# Patient Record
Sex: Male | Born: 2000 | Race: White | Hispanic: No | Marital: Single
Health system: Southern US, Community
[De-identification: ages and names within clinical notes are randomized; demographics above are authoritative.]

## PROBLEM LIST (undated history)

## (undated) DIAGNOSIS — J302 Other seasonal allergic rhinitis: Secondary | ICD-10-CM

## (undated) DIAGNOSIS — F909 Attention-deficit hyperactivity disorder, unspecified type: Secondary | ICD-10-CM

## (undated) HISTORY — PX: TYMPANOSTOMY: SHX2586

---
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
## 2023-11-04 ENCOUNTER — Ambulatory Visit: Admission: RE | Admit: 2023-11-04 | Payer: Self-pay | Source: Ambulatory Visit

## 2023-11-11 ENCOUNTER — Ambulatory Visit
Admission: RE | Admit: 2023-11-11 | Discharge: 2023-11-11 | Disposition: A | Discharge status: Home / Self Care | Source: Ambulatory Visit | Attending: Family Medicine | Admitting: Family Medicine

## 2023-11-11 VITALS — BP 115/63 | HR 86 | Temp 98.1°F | Resp 17

## 2023-11-11 DIAGNOSIS — J028 Acute pharyngitis due to other specified organisms: Secondary | ICD-10-CM | POA: Insufficient documentation

## 2023-11-11 DIAGNOSIS — R112 Nausea with vomiting, unspecified: Secondary | ICD-10-CM | POA: Insufficient documentation

## 2023-11-11 DIAGNOSIS — B9689 Other specified bacterial agents as the cause of diseases classified elsewhere: Secondary | ICD-10-CM | POA: Diagnosis not present

## 2023-11-11 LAB — POCT RAPID STREP A (OFFICE): Rapid Strep A Screen: NEGATIVE

## 2023-11-11 LAB — POC COVID19/FLU A&B COMBO
Covid Antigen, POC: NEGATIVE
Influenza A Antigen, POC: NEGATIVE
Influenza B Antigen, POC: NEGATIVE

## 2023-11-11 MED ORDER — ONDANSETRON 4 MG PO TBDP: 4.0000 mg | ORAL_TABLET | Freq: Three times a day (TID) | ORAL | 0 refills | Status: AC | PRN

## 2023-11-11 MED ORDER — AMOXICILLIN 500 MG PO CAPS: 500.0000 mg | ORAL_CAPSULE | Freq: Two times a day (BID) | ORAL | 0 refills | Status: AC

## 2023-11-11 MED ORDER — IBUPROFEN 800 MG PO TABS
800.0000 mg | ORAL_TABLET | Freq: Once | ORAL | Status: AC
  Administered 2023-11-11: 800 mg via ORAL

## 2023-11-11 MED ORDER — ONDANSETRON 4 MG PO TBDP
4.0000 mg | ORAL_TABLET | Freq: Once | ORAL | Status: AC
  Administered 2023-11-11: 4 mg via ORAL

## 2023-11-11 NOTE — ED Triage Notes (Signed)
 Pt c/o sore throat for 2 days. Pt states he threw up this morning around 12am. Has not thrown up since then

## 2023-11-11 NOTE — Discharge Instructions (Addendum)
 Your strep test was negative, however I'm concerned you may have bacteria to the throat based on your physical exam findings and your symptoms.  Take antibiotic for the next 10 days as prescribed.  I have sent the strep test for culture (looks for bacteria under a microscope) and will call you if the results change the treatment plan.  Take tylenol and ibuprofen as needed for pain. Salt water gargles as needed.  Change your toothbrush after 2-3 days of antibiotics to prevent re-infection.  If you develop any new or worsening symptoms or if your symptoms do not start to improve, please return here or follow-up with your primary care provider. If your symptoms are severe, please go to the emergency room.

## 2023-11-11 NOTE — ED Provider Notes (Signed)
 [name]    CSN: [id number] Arrival date & time: 11/11/23  1211      History   Chief Complaint Chief Complaint  Patient presents with   Sore Throat    I woke up at 12 am and puked I don't know if it was something I was allergic to or what - Entered by patient    HPI Hector Lyons is a 23 y.o. male.   Patient presents to urgent care for evaluation of sore throat that started 2 days ago and nausea and vomiting that started last night.  He has had 1-2 episodes of nonbilious/nonbloody emesis over the night last night and remains nauseous currently.  Sore throat is a 7 on a scale of 0-10 and worsened by coughing/swallowing.  Reports a dry/throat clearing cough.  Reports chills without known fever at home.  Denies abdominal pain, rash, dizziness, shortness of breath, chest pain, ear pain, headache, body aches, and recent sick contacts with similar symptoms.  He has not achieved his flu vaccine this year.  Denies recent antibiotic and steroid use.  He has not attempted treatment of symptoms prior to arrival.   Sore Throat    Past Medical History:  Diagnosis Date   ADHD (attention deficit hyperactivity disorder)    Seasonal allergies     There are no active problems to display for this patient.   Past Surgical History:  Procedure Laterality Date   TYMPANOSTOMY         Home Medications    Prior to Admission medications   Medication Sig Start Date End Date Taking? Authorizing Provider  amoxicillin (AMOXIL) 500 MG capsule Take 1 capsule (500 mg total) by mouth 2 (two) times daily for 10 days. 11/11/23 11/21/23 Yes Carlisle Beers, FNP  diphenoxylate-atropine (LOMOTIL) 2.5-0.025 MG tablet Take 1 tablet by mouth 4 (four) times daily as needed for diarrhea or loose stools. 05/22/17   Burgess Amor, PA-C  fluticasone (FLONASE) 50 MCG/ACT nasal spray Place 1 spray into both nostrils daily. 01/04/17 02/03/17  Lucia Estelle, NP    Family History History reviewed. No  pertinent family history.  Social History Social History   Tobacco Use   Smoking status: Passive Smoke Exposure - Never Smoker   Smokeless tobacco: Never  Substance Use Topics   Alcohol use: No   Drug use: No     Allergies   Bee venom   Review of Systems Review of Systems Per HPI  Physical Exam Triage Vital Signs ED Triage Vitals  Encounter Vitals Group     BP 11/11/23 1217 115/63     Systolic BP Percentile --      Diastolic BP Percentile --      Pulse Rate 11/11/23 1217 86     Resp 11/11/23 1217 17     Temp 11/11/23 1217 98.1 F (36.7 C)     Temp Source 11/11/23 1217 Oral     SpO2 11/11/23 1217 98 %     Weight --      Height --      Head Circumference --      Peak Flow --      Pain Score 11/11/23 1220 7     Pain Loc --      Pain Education --      Exclude from Growth Chart --    No data found.  Updated Vital Signs BP 115/63 (BP Location: Right Arm)   Pulse 86   Temp 98.1 F (36.7 C) (Oral)  Resp 17   SpO2 98%   Visual Acuity Right Eye Distance:   Left Eye Distance:   Bilateral Distance:    Right Eye Near:   Left Eye Near:    Bilateral Near:     Physical Exam Vitals and nursing note reviewed.  Constitutional:      Appearance: He is not ill-appearing or toxic-appearing.  HENT:     Head: Normocephalic and atraumatic.     Right Ear: Hearing, tympanic membrane, ear canal and external ear normal.     Left Ear: Hearing, tympanic membrane, ear canal and external ear normal.     Nose: Nose normal.     Mouth/Throat:     Lips: Pink.     Mouth: Mucous membranes are moist. No injury or oral lesions.     Dentition: Normal dentition.     Tongue: No lesions.     Pharynx: Uvula midline. Pharyngeal swelling and posterior oropharyngeal erythema present. No oropharyngeal exudate, uvula swelling or postnasal drip.     Tonsils: No tonsillar exudate. 2+ on the right. 2+ on the left.     Comments: No trismus, phonation normal, maintaining secretions without  difficulty.  Eyes:     General: Lids are normal. Vision grossly intact. Gaze aligned appropriately.     Extraocular Movements: Extraocular movements intact.     Conjunctiva/sclera: Conjunctivae normal.  Neck:     Trachea: Trachea and phonation normal.  Cardiovascular:     Rate and Rhythm: Normal rate and regular rhythm.     Heart sounds: Normal heart sounds, S1 normal and S2 normal.  Pulmonary:     Effort: Pulmonary effort is normal. No respiratory distress.     Breath sounds: Normal breath sounds and air entry. No wheezing, rhonchi or rales.  Chest:     Chest wall: No tenderness.  Musculoskeletal:     Cervical back: Neck supple.  Lymphadenopathy:     Cervical: No cervical adenopathy.  Skin:    General: Skin is warm and dry.     Capillary Refill: Capillary refill takes less than 2 seconds.     Findings: No rash.  Neurological:     General: No focal deficit present.     Mental Status: He is alert and oriented to person, place, and time. Mental status is at baseline.     Cranial Nerves: No dysarthria or facial asymmetry.  Psychiatric:        Mood and Affect: Mood normal.        Speech: Speech normal.        Behavior: Behavior normal.        Thought Content: Thought content normal.        Judgment: Judgment normal.      UC Treatments / Results  Labs (all labs ordered are listed, but only abnormal results are displayed) Labs Reviewed  POCT RAPID STREP A (OFFICE)  POC COVID19/FLU A&B COMBO    EKG   Radiology No results found.  Procedures Procedures (including critical care time)  Medications Ordered in UC Medications  ondansetron (ZOFRAN-ODT) disintegrating tablet 4 mg (4 mg Oral Given 11/11/23 1258)  ibuprofen (ADVIL) tablet 800 mg (800 mg Oral Given 11/11/23 1258)    Initial Impression / Assessment and Plan / UC Course  I have reviewed the triage vital signs and the nursing notes.  Pertinent labs & imaging results that were available during my care of the  patient were reviewed by me and considered in my medical decision making (see chart for  details).   1. Acute bacterial pharyngitis Group A strep testing is negative.  Throat culture is pending.  COVID and flu testing are negative via point-of-care.  Given patient's history and exam findings, I would like to treat empirically for bacterial pharyngitis. Amoxicillin ordered. No red flag signs or symptoms on exam indicating need for referral to the emergency room.  Zofran and ibuprofen in clinic for acute pain and nausea/vomiting.  Recommend supportive care for symptomatic relief as outlined in AVS.   Counseled patient on potential for adverse effects with medications prescribed/recommended today, strict ER and return-to-clinic precautions discussed, patient verbalized understanding.    Final Clinical Impressions(s) / UC Diagnoses   Final diagnoses:  Acute bacterial pharyngitis     Discharge Instructions      Your strep test was negative, however I'm concerned you may have bacteria to the throat based on your physical exam findings and your symptoms.  Take antibiotic for the next 10 days as prescribed.  I have sent the strep test for culture (looks for bacteria under a microscope) and will call you if the results change the treatment plan.  Take tylenol and ibuprofen as needed for pain. Salt water gargles as needed.  Change your toothbrush after 2-3 days of antibiotics to prevent re-infection.  If you develop any new or worsening symptoms or if your symptoms do not start to improve, please return here or follow-up with your primary care provider. If your symptoms are severe, please go to the emergency room.     ED Prescriptions     Medication Sig Dispense Auth. Provider   amoxicillin (AMOXIL) 500 MG capsule Take 1 capsule (500 mg total) by mouth 2 (two) times daily for 10 days. 20 capsule Carlisle Beers, FNP      PDMP not reviewed this encounter.   Carlisle Beers, [place] 11/11/23 1309

## 2023-11-14 LAB — CULTURE, GROUP A STREP (THRC)

## 2023-12-08 ENCOUNTER — Ambulatory Visit
Admission: RE | Admit: 2023-12-08 | Discharge: 2023-12-08 | Disposition: A | Discharge status: Home / Self Care | Source: Ambulatory Visit | Attending: Internal Medicine | Admitting: Internal Medicine

## 2023-12-08 VITALS — BP 120/75 | HR 65 | Temp 97.9°F | Resp 16

## 2023-12-08 DIAGNOSIS — H6123 Impacted cerumen, bilateral: Secondary | ICD-10-CM

## 2023-12-08 DIAGNOSIS — H1013 Acute atopic conjunctivitis, bilateral: Secondary | ICD-10-CM | POA: Diagnosis not present

## 2023-12-08 DIAGNOSIS — J309 Allergic rhinitis, unspecified: Secondary | ICD-10-CM

## 2023-12-08 MED ORDER — CETIRIZINE HCL 10 MG PO TABS: 10.0000 mg | ORAL_TABLET | Freq: Every day | ORAL | 2 refills | Status: AC

## 2023-12-08 MED ORDER — FLUTICASONE PROPIONATE 50 MCG/ACT NA SUSP: 2.0000 | Freq: Every day | NASAL | 0 refills | Status: AC

## 2023-12-08 MED ORDER — OLOPATADINE HCL 0.1 % OP SOLN: 1.0000 [drp] | Freq: Two times a day (BID) | OPHTHALMIC | 1 refills | Status: AC

## 2023-12-08 NOTE — ED Provider Notes (Signed)
 [facility]    CSN: [id number] Arrival date & time: 12/08/23  1141      History   Chief Complaint Chief Complaint  Patient presents with   Hearing Problem    My left ear feels like it's clogged with wax I had this issue before and they just rinsed my ear out but I also need to see about getting a prescription for my allergies it's making it hard to work - Entered by patient    HPI Hector Lyons is a 23 y.o. male.   Hector Lyons is a 23 y.o. male presenting for chief complaint of watery/itchy eyes and rhinorrhea that started 1 week ago. He suspects he could be allergic to pollen. He has had seasonal allergies in the past. Eyes are watery/itchy and without redness/crusty drainage. Nose is runny. Denies cough, fever, chills, nausea/vomiting, abdominal pain, rash. He additionally feels as though the left ear is "full of wax". Reports irritation/ear fullness sensation to the left ear that started a few days ago. He has been taking Xyzal with minimal relief.      Past Medical History:  Diagnosis Date   ADHD (attention deficit hyperactivity disorder)    Seasonal allergies     There are no active problems to display for this patient.   Past Surgical History:  Procedure Laterality Date   TYMPANOSTOMY         Home Medications    Prior to Admission medications   Medication Sig Start Date End Date Taking? Authorizing Provider  cetirizine (ZYRTEC ALLERGY) 10 MG tablet Take 1 tablet (10 mg total) by mouth at bedtime. 12/08/23  Yes Carlisle Beers, FNP  olopatadine (PATANOL) 0.1 % ophthalmic solution Place 1 drop into both eyes 2 (two) times daily. 12/08/23  Yes Carlisle Beers, FNP  diphenoxylate-atropine (LOMOTIL) 2.5-0.025 MG tablet Take 1 tablet by mouth 4 (four) times daily as needed for diarrhea or loose stools. 05/22/17   Burgess Amor, PA-C  fluticasone (FLONASE) 50 MCG/ACT nasal spray Place 1 spray into both nostrils daily. 01/04/17 02/03/17  Lucia Estelle, NP   ondansetron (ZOFRAN-ODT) 4 MG disintegrating tablet Take 1 tablet (4 mg total) by mouth every 8 (eight) hours as needed for nausea or vomiting. 11/11/23   Carlisle Beers, FNP    Family History History reviewed. No pertinent family history.  Social History Social History   Tobacco Use   Smoking status: Passive Smoke Exposure - Never Smoker   Smokeless tobacco: Never  Substance Use Topics   Alcohol use: No   Drug use: No     Allergies   Bee venom   Review of Systems Review of Systems Per HPI  Physical Exam Triage Vital Signs ED Triage Vitals  Encounter Vitals Group     BP 12/08/23 1149 120/75     Systolic BP Percentile --      Diastolic BP Percentile --      Pulse Rate 12/08/23 1149 65     Resp 12/08/23 1149 16     Temp 12/08/23 1149 97.9 F (36.6 C)     Temp Source 12/08/23 1149 Oral     SpO2 12/08/23 1149 98 %     Weight --      Height --      Head Circumference --      Peak Flow --      Pain Score 12/08/23 1151 0     Pain Loc --      Pain Education --  Exclude from Growth Chart --    No data found.  Updated Vital Signs BP 120/75 (BP Location: Right Arm)   Pulse 65   Temp 97.9 F (36.6 C) (Oral)   Resp 16   SpO2 98%   Visual Acuity Right Eye Distance:   Left Eye Distance:   Bilateral Distance:    Right Eye Near:   Left Eye Near:    Bilateral Near:     Physical Exam Vitals and nursing note reviewed.  Constitutional:      Appearance: He is not ill-appearing or toxic-appearing.  HENT:     Head: Normocephalic and atraumatic.     Right Ear: Hearing, tympanic membrane, ear canal and external ear normal. There is impacted cerumen.     Left Ear: Hearing, tympanic membrane, ear canal and external ear normal. There is impacted cerumen.     Nose: Nose normal.     Mouth/Throat:     Lips: Pink.     Mouth: Mucous membranes are moist. No injury or oral lesions.     Dentition: Normal dentition.     Tongue: No lesions.     Pharynx:  Oropharynx is clear. Uvula midline. No pharyngeal swelling, oropharyngeal exudate, posterior oropharyngeal erythema, uvula swelling or postnasal drip.     Tonsils: No tonsillar exudate.  Eyes:     General: Lids are normal. Vision grossly intact. Gaze aligned appropriately.     Extraocular Movements: Extraocular movements intact.     Conjunctiva/sclera: Conjunctivae normal.  Neck:     Trachea: Trachea and phonation normal.  Cardiovascular:     Rate and Rhythm: Normal rate and regular rhythm.     Heart sounds: Normal heart sounds, S1 normal and S2 normal.  Pulmonary:     Effort: Pulmonary effort is normal. No respiratory distress.     Breath sounds: Normal breath sounds and air entry.  Musculoskeletal:     Cervical back: Neck supple.  Lymphadenopathy:     Cervical: No cervical adenopathy.  Skin:    General: Skin is warm and dry.     Capillary Refill: Capillary refill takes less than 2 seconds.     Findings: No rash.  Neurological:     General: No focal deficit present.     Mental Status: He is alert and oriented to person, place, and time. Mental status is at baseline.     Cranial Nerves: No dysarthria or facial asymmetry.  Psychiatric:        Mood and Affect: Mood normal.        Speech: Speech normal.        Behavior: Behavior normal.        Thought Content: Thought content normal.        Judgment: Judgment normal.      UC Treatments / Results  Labs (all labs ordered are listed, but only abnormal results are displayed) Labs Reviewed - No data to display  EKG   Radiology No results found.  Procedures Procedures (including critical care time)  Medications Ordered in UC Medications - No data to display  Initial Impression / Assessment and Plan / UC Course  I have reviewed the triage vital signs and the nursing notes.  Pertinent labs & imaging results that were available during my care of the patient were reviewed by me and considered in my medical decision making  (see chart for details).   1. Allergic conjunctivitis and rhinitis bilateral Symptoms due to allergies.  Low suspicion for viral/bacterial conjunctivitis/etiology of symptoms.  Olopatadine eye drops and zyrtec as directed.  Supportive care/warm compresses discussed.    2. Bilateral impacted cerumen Both ear(s) cleaned with ear lavage to remove ear wax impactions bilaterally by nursing staff.  Reassessment shows normal eardrums/canals. No signs of otitis media/otitis externa.   Patient may use debrox ear drops at home as needed for wax removal and has been advised to avoid using Q-tips.   Counseled patient on potential for adverse effects with medications prescribed/recommended today, strict ER and return-to-clinic precautions discussed, patient verbalized understanding.    Final Clinical Impressions(s) / UC Diagnoses   Final diagnoses:  Allergic conjunctivitis and rhinitis, bilateral  Bilateral impacted cerumen     Discharge Instructions      Your symptoms are likely due to environmental allergies.  Avoid exposure to allergens. Take oral antihistamine (Either Zyrtec, Claritin, or Allegra) and use Flonase daily as directed. You can buy these medications over the counter. These medications can take a few days to fully kick in to your body and start working. Return for any new or worsening symptoms.  If your eyes become itchy, you may purchase olopatadine (Pataday) eyedrops over the counter and use as directed to relieve watery/itchy eyes associated with allergies as well.   If your symptoms are severe, please go to the emergency room for further evaluation. Schedule an appointment with your primary care provider for follow-up and further management of your seasonal allergies as well as ongoing preventive healthcare.      ED Prescriptions     Medication Sig Dispense Auth. Provider   olopatadine (PATANOL) 0.1 % ophthalmic solution Place 1 drop into both eyes 2 (two) times daily. 5  mL Carlisle Beers, FNP   cetirizine (ZYRTEC ALLERGY) 10 MG tablet Take 1 tablet (10 mg total) by mouth at bedtime. 30 tablet Carlisle Beers, FNP      PDMP not reviewed this encounter.   Carlisle Beers, [place] 12/08/23 1220

## 2023-12-08 NOTE — ED Triage Notes (Signed)
 Pt reports sinus pressure, nasal drainage,  watery eyes, x 1 week

## 2023-12-08 NOTE — Discharge Instructions (Signed)
Your symptoms are likely due to environmental allergies.  Avoid exposure to allergens. Take oral antihistamine (Either Zyrtec, Claritin, or Allegra) and use Flonase daily as directed. You can buy these medications over the counter. These medications can take a few days to fully kick in to your body and start working. Return for any new or worsening symptoms.  If your eyes become itchy, you may purchase olopatadine (Pataday) eyedrops over the counter and use as directed to relieve watery/itchy eyes associated with allergies as well.   If your symptoms are severe, please go to the emergency room for further evaluation. Schedule an appointment with your primary care provider for follow-up and further management of your seasonal allergies as well as ongoing preventive healthcare.

## 2024-01-27 ENCOUNTER — Ambulatory Visit
Admission: RE | Admit: 2024-01-27 | Discharge: 2024-01-27 | Disposition: A | Discharge status: Home / Self Care | Source: Ambulatory Visit | Attending: Family Medicine | Admitting: Family Medicine

## 2024-01-27 ENCOUNTER — Other Ambulatory Visit: Payer: Self-pay

## 2024-01-27 VITALS — BP 122/77 | HR 68 | Temp 98.5°F | Resp 18

## 2024-01-27 DIAGNOSIS — M25561 Pain in right knee: Secondary | ICD-10-CM

## 2024-01-27 MED ORDER — IBUPROFEN 800 MG PO TABS: 800.0000 mg | ORAL_TABLET | Freq: Three times a day (TID) | ORAL | 0 refills | Status: AC | PRN
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
## 2024-04-14 ENCOUNTER — Ambulatory Visit: Payer: Self-pay

## 2024-04-15 ENCOUNTER — Ambulatory Visit: Payer: Self-pay

## 2024-04-19 ENCOUNTER — Ambulatory Visit
Admission: RE | Admit: 2024-04-19 | Discharge: 2024-04-19 | Disposition: A | Discharge status: Home / Self Care | Attending: Family Medicine | Admitting: Family Medicine

## 2024-04-19 VITALS — BP 121/79 | HR 82 | Temp 98.6°F | Resp 18

## 2024-04-19 DIAGNOSIS — J029 Acute pharyngitis, unspecified: Secondary | ICD-10-CM | POA: Diagnosis not present

## 2024-04-19 DIAGNOSIS — J069 Acute upper respiratory infection, unspecified: Secondary | ICD-10-CM | POA: Diagnosis not present

## 2024-04-19 LAB — POCT RAPID STREP A (OFFICE): Rapid Strep A Screen: NEGATIVE

## 2024-04-19 MED ORDER — AZELASTINE HCL 0.1 % NA SOLN: 1.0000 | Freq: Two times a day (BID) | NASAL | 0 refills | Status: AC

## 2024-04-19 MED ORDER — PROMETHAZINE-DM 6.25-15 MG/5ML PO SYRP: 5.0000 mL | ORAL_SOLUTION | Freq: Four times a day (QID) | ORAL | 0 refills | Status: AC | PRN

## 2024-04-19 NOTE — ED Triage Notes (Signed)
 Pt reports sore throat, headache cough fatigue weakness loss of appetite. Started x 3 days ago. Pt has tried OTC allergy relief but it did not help with sx's.

## 2024-04-19 NOTE — ED Provider Notes (Signed)
 [facility]    CSN: [id number] Arrival date & time: 04/19/24  1203      History   Chief Complaint Chief Complaint  Patient presents with   Sore Throat    I've had a Scratchy throat and a cough for the past couple of days sometimes I get congestion that makes my chest feel tight - Entered by patient    HPI Hector Lyons is a 23 y.o. male.   Patient presenting today with 3-day history of sore throat, headache, cough, fatigue, weakness, decreased appetite, occasional chest tightness.  Denies fever, chills, chest pain, shortness of breath, abdominal pain, vomiting, diarrhea.  So far trying antihistamines with no relief.  No known history of chronic pulmonary disease.    Past Medical History:  Diagnosis Date   ADHD (attention deficit hyperactivity disorder)    Seasonal allergies     There are no active problems to display for this patient.   Past Surgical History:  Procedure Laterality Date   TYMPANOSTOMY         Home Medications    Prior to Admission medications   Medication Sig Start Date End Date Taking? Authorizing Provider  azelastine  (ASTELIN ) 0.1 % nasal spray Place 1 spray into both nostrils 2 (two) times daily. Use in each nostril as directed 04/19/24  Yes Stuart Vernell Norris, PA-C  promethazine -dextromethorphan (PROMETHAZINE -DM) 6.25-15 MG/5ML syrup Take 5 mLs by mouth 4 (four) times daily as needed. 04/19/24  Yes Stuart Vernell Norris, PA-C  cetirizine  (ZYRTEC  ALLERGY) 10 MG tablet Take 1 tablet (10 mg total) by mouth at bedtime. 12/08/23   Enedelia Dorna HERO, FNP  diphenoxylate -atropine  (LOMOTIL ) 2.5-0.025 MG tablet Take 1 tablet by mouth 4 (four) times daily as needed for diarrhea or loose stools. 05/22/17   Idol, Julie, PA-C  fluticasone  (FLONASE ) 50 MCG/ACT nasal spray Place 2 sprays into both nostrils daily. 12/08/23   Enedelia Dorna HERO, FNP  ibuprofen  (ADVIL ) 800 MG tablet Take 1 tablet (800 mg total) by mouth every 8 (eight) hours as  needed. 01/27/24   Stuart Vernell Norris, PA-C  olopatadine  (PATANOL) 0.1 % ophthalmic solution Place 1 drop into both eyes 2 (two) times daily. 12/08/23   Enedelia Dorna HERO, FNP  ondansetron  (ZOFRAN -ODT) 4 MG disintegrating tablet Take 1 tablet (4 mg total) by mouth every 8 (eight) hours as needed for nausea or vomiting. 11/11/23   Enedelia Dorna HERO, FNP    Family History History reviewed. No pertinent family history.  Social History Social History   Tobacco Use   Smoking status: Passive Smoke Exposure - Never Smoker   Smokeless tobacco: Never  Substance Use Topics   Alcohol use: No   Drug use: No     Allergies   Bee venom   Review of Systems Review of Systems Per HPI  Physical Exam Triage Vital Signs ED Triage Vitals  Encounter Vitals Group     BP 04/19/24 1236 121/79     Girls Systolic BP Percentile --      Girls Diastolic BP Percentile --      Boys Systolic BP Percentile --      Boys Diastolic BP Percentile --      Pulse Rate 04/19/24 1236 82     Resp 04/19/24 1236 18     Temp 04/19/24 1236 98.6 F (37 C)     Temp Source 04/19/24 1236 Oral     SpO2 04/19/24 1236 98 %     Weight --      Height --  Head Circumference --      Peak Flow --      Pain Score 04/19/24 1239 0     Pain Loc --      Pain Education --      Exclude from Growth Chart --    No data found.  Updated Vital Signs BP 121/79 (BP Location: Right Arm)   Pulse 82   Temp 98.6 F (37 C) (Oral)   Resp 18   SpO2 98%   Visual Acuity Right Eye Distance:   Left Eye Distance:   Bilateral Distance:    Right Eye Near:   Left Eye Near:    Bilateral Near:     Physical Exam Vitals and nursing note reviewed.  Constitutional:      Appearance: He is well-developed.  HENT:     Head: Atraumatic.     Right Ear: External ear normal.     Left Ear: External ear normal.     Nose: Rhinorrhea present.     Mouth/Throat:     Pharynx: Posterior oropharyngeal erythema present. No oropharyngeal  exudate.  Eyes:     Conjunctiva/sclera: Conjunctivae normal.     Pupils: Pupils are equal, round, and reactive to light.  Cardiovascular:     Rate and Rhythm: Normal rate and regular rhythm.  Pulmonary:     Effort: Pulmonary effort is normal. No respiratory distress.     Breath sounds: No wheezing or rales.  Musculoskeletal:        General: Normal range of motion.     Cervical back: Normal range of motion and neck supple.  Lymphadenopathy:     Cervical: No cervical adenopathy.  Skin:    General: Skin is warm and dry.  Neurological:     Mental Status: He is alert and oriented to person, place, and time.  Psychiatric:        Behavior: Behavior normal.      UC Treatments / Results  Labs (all labs ordered are listed, but only abnormal results are displayed) Labs Reviewed  POCT RAPID STREP A (OFFICE)    EKG   Radiology No results found.  Procedures Procedures (including critical care time)  Medications Ordered in UC Medications - No data to display  Initial Impression / Assessment and Plan / UC Course  I have reviewed the triage vital signs and the nursing notes.  Pertinent labs & imaging results that were available during my care of the patient were reviewed by me and considered in my medical decision making (see chart for details).     Vital signs and exam very reassuring today, suspect viral respiratory infection.  Rapid strep negative.  Will treat with Astelin , Phenergan  DM, supportive over-the-counter medications and home care.  Return for worsening symptoms.  Final Clinical Impressions(s) / UC Diagnoses   Final diagnoses:  Sore throat  Viral URI with cough   Discharge Instructions   None    ED Prescriptions     Medication Sig Dispense Auth. Provider   azelastine  (ASTELIN ) 0.1 % nasal spray Place 1 spray into both nostrils 2 (two) times daily. Use in each nostril as directed 30 mL Stuart Vernell Norris, PA-C   promethazine -dextromethorphan  (PROMETHAZINE -DM) 6.25-15 MG/5ML syrup Take 5 mLs by mouth 4 (four) times daily as needed. 100 mL Stuart Vernell Norris, [place]      PDMP not reviewed this encounter.   Stuart Vernell Norris, [place] 04/19/24 1252
